# Patient Record
Sex: Female | Born: 1995 | Race: White | Hispanic: No | Marital: Single | Smoking: Never smoker
Health system: Southern US, Community
[De-identification: ages and names within clinical notes are randomized; demographics above are authoritative.]

## PROBLEM LIST (undated history)

## (undated) DIAGNOSIS — C9100 Acute lymphoblastic leukemia not having achieved remission: Secondary | ICD-10-CM

## (undated) HISTORY — DX: Acute lymphoblastic leukemia not having achieved remission: C91.00

## (undated) HISTORY — PX: OTHER SURGICAL HISTORY: SHX169

---

## 2017-01-05 DIAGNOSIS — C9101 Acute lymphoblastic leukemia, in remission: Secondary | ICD-10-CM | POA: Insufficient documentation

## 2017-02-20 ENCOUNTER — Other Ambulatory Visit
Admission: RE | Admit: 2017-02-20 | Discharge: 2017-02-20 | Disposition: A | Discharge status: Home / Self Care | Payer: No Typology Code available for payment source | Source: Ambulatory Visit | Attending: Pediatric Hematology and Oncology | Admitting: Pediatric Hematology and Oncology

## 2017-02-20 DIAGNOSIS — C9101 Acute lymphoblastic leukemia, in remission: Secondary | ICD-10-CM | POA: Diagnosis not present

## 2017-02-20 LAB — CBC WITH DIFFERENTIAL/PLATELET
BASOS PCT: 0 %
Basophils Absolute: 0 10*3/uL (ref 0–0.1)
EOS ABS: 0 10*3/uL (ref 0–0.7)
EOS PCT: 0 %
HCT: 27.9 % — ABNORMAL LOW (ref 35.0–47.0)
HEMOGLOBIN: 9.8 g/dL — AB (ref 12.0–16.0)
Lymphocytes Relative: 42 %
Lymphs Abs: 0.5 10*3/uL — ABNORMAL LOW (ref 1.0–3.6)
MCH: 37.6 pg — ABNORMAL HIGH (ref 26.0–34.0)
MCHC: 35.3 g/dL (ref 32.0–36.0)
MCV: 106.4 fL — ABNORMAL HIGH (ref 80.0–100.0)
Monocytes Absolute: 0.2 10*3/uL (ref 0.2–0.9)
Monocytes Relative: 17 %
NEUTROS PCT: 41 %
Neutro Abs: 0.5 10*3/uL — ABNORMAL LOW (ref 1.4–6.5)
Platelets: 229 10*3/uL (ref 150–440)
RBC: 2.62 MIL/uL — AB (ref 3.80–5.20)
RDW: 17 % — AB (ref 11.5–14.5)
WBC: 1.2 10*3/uL — AB (ref 3.6–11.0)

## 2019-06-20 ENCOUNTER — Ambulatory Visit: Payer: No Typology Code available for payment source | Attending: Internal Medicine

## 2019-06-20 DIAGNOSIS — Z20822 Contact with and (suspected) exposure to covid-19: Secondary | ICD-10-CM

## 2019-06-21 LAB — NOVEL CORONAVIRUS, NAA: SARS-CoV-2, NAA: NOT DETECTED

## 2019-07-18 ENCOUNTER — Ambulatory Visit: Payer: No Typology Code available for payment source | Attending: Internal Medicine

## 2019-07-18 DIAGNOSIS — Z20822 Contact with and (suspected) exposure to covid-19: Secondary | ICD-10-CM

## 2019-07-19 LAB — NOVEL CORONAVIRUS, NAA: SARS-CoV-2, NAA: NOT DETECTED

## 2019-07-20 ENCOUNTER — Other Ambulatory Visit: Payer: No Typology Code available for payment source

## 2020-03-21 ENCOUNTER — Other Ambulatory Visit: Payer: Self-pay | Admitting: Sports Medicine

## 2020-03-21 DIAGNOSIS — M25461 Effusion, right knee: Secondary | ICD-10-CM

## 2020-03-21 DIAGNOSIS — G8929 Other chronic pain: Secondary | ICD-10-CM

## 2020-04-05 ENCOUNTER — Ambulatory Visit
Admission: RE | Admit: 2020-04-05 | Discharge: 2020-04-05 | Disposition: A | Discharge status: Home / Self Care | Payer: 59 | Source: Ambulatory Visit | Attending: Sports Medicine | Admitting: Sports Medicine

## 2020-04-05 ENCOUNTER — Other Ambulatory Visit: Payer: Self-pay

## 2020-04-05 DIAGNOSIS — M25461 Effusion, right knee: Secondary | ICD-10-CM | POA: Diagnosis present

## 2020-04-05 DIAGNOSIS — M25561 Pain in right knee: Secondary | ICD-10-CM | POA: Insufficient documentation

## 2020-04-05 DIAGNOSIS — G8929 Other chronic pain: Secondary | ICD-10-CM | POA: Diagnosis present

## 2020-04-05 IMAGING — MR MR KNEE*R* W/O CM
6 series · 40 of 40 positions shown · non-contrast
Comparison: None.

CLINICAL DATA: History of leukemia with steroid therapy. Lateral
right knee pain, swelling and inflammation for 1 year. No known
injury.

EXAM:
MRI OF THE RIGHT KNEE WITHOUT CONTRAST
TECHNIQUE: Multiplanar, multisequence MR imaging of the knee was performed. No
intravenous contrast was administered.

[Series 15: T2 fat-sat · axial · right · 4.0mm · 0.50mm/px · z∈[-31,+92]mm · 7 of 26 slices shown (1 of 3)]
[im 1/26]
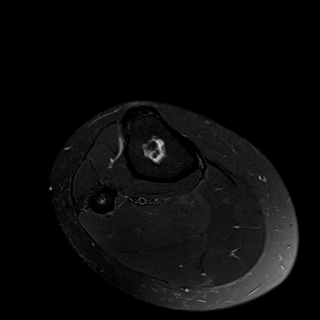
[im 5/26]
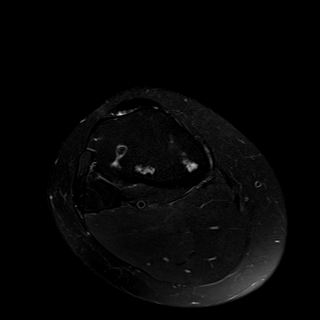
[im 9/26]
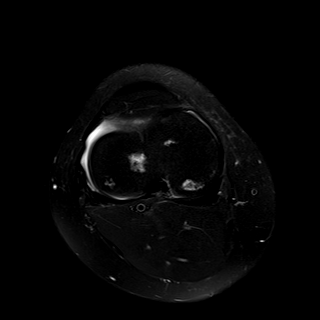
[im 13/26]
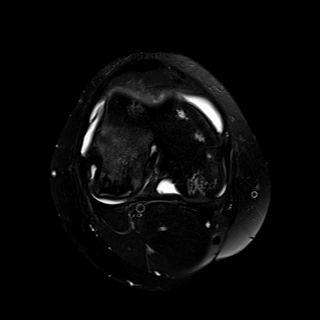
[im 17/26]
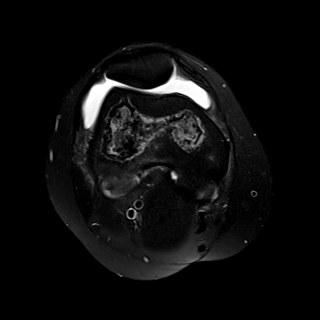
[im 21/26]
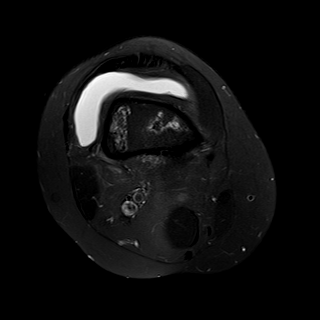
[im 26/26]
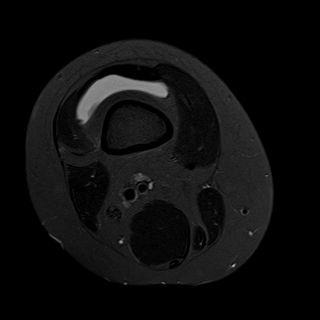

[Series 16: T1 · coronal · right · 4.0mm · 0.39mm/px · 7 of 28 slices shown]
[im 1/28]
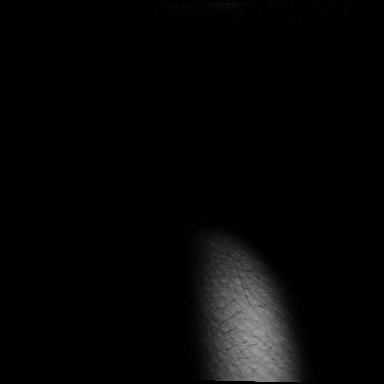
[im 5/28]
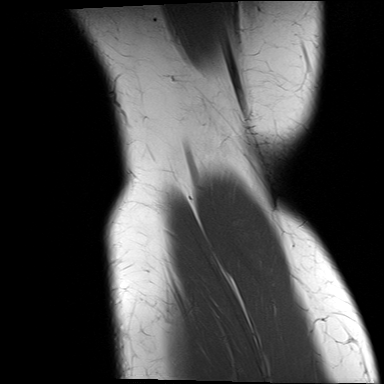
[im 10/28]
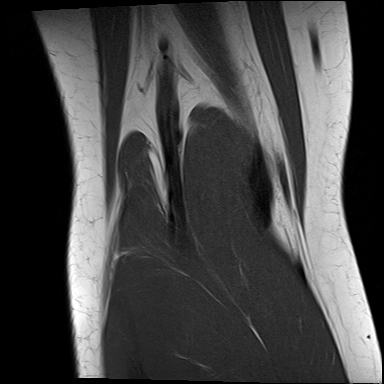
[im 14/28]
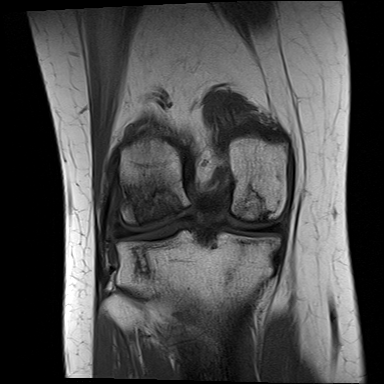
[im 19/28]
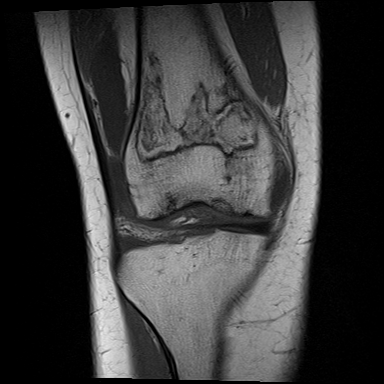
[im 23/28]
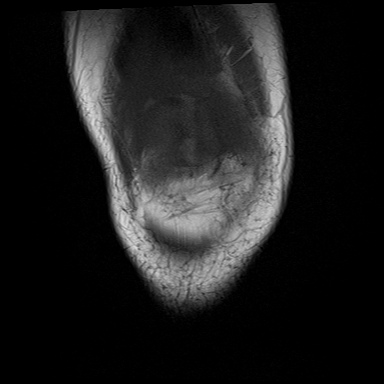
[im 28/28]
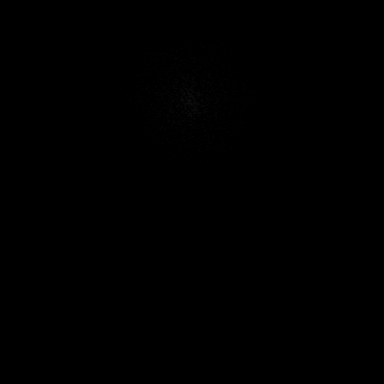

[Series 17: T2 fat-sat · coronal · right · 4.0mm · 0.59mm/px · 6 of 28 slices shown (2 of 3)]
[im 1/28]
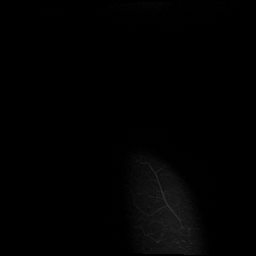
[im 6/28]
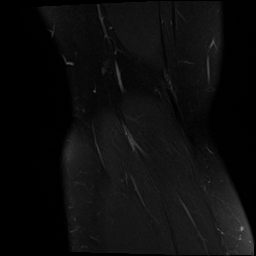
[im 11/28]
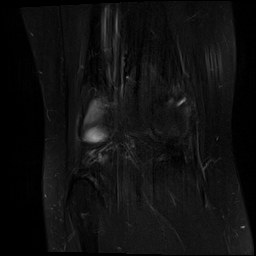
[im 17/28]
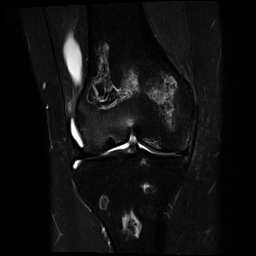
[im 22/28]
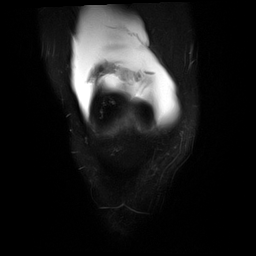
[im 28/28]
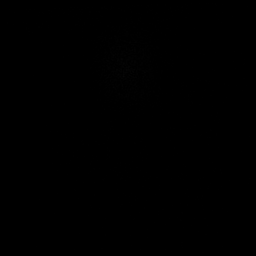

[Series 18: PD fat-sat · coronal · right · 4.0mm · 0.59mm/px · 6 of 28 slices shown (1 of 2)]
[im 1/28]
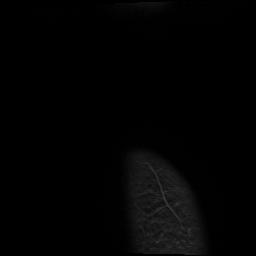
[im 6/28]
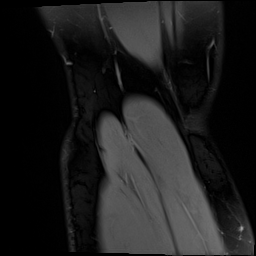
[im 11/28]
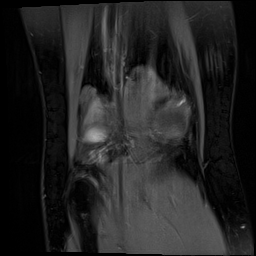
[im 17/28]
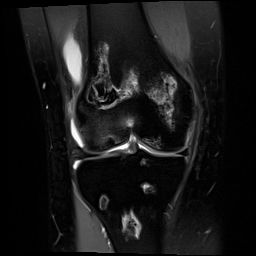
[im 22/28]
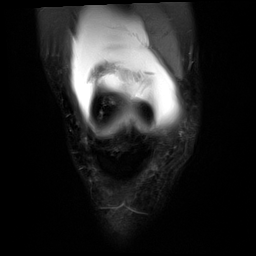
[im 28/28]
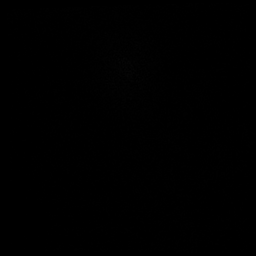

[Series 19: PD fat-sat · sagittal · right · 3.0mm · 0.59mm/px · 7 of 31 slices shown (2 of 2)]
[im 1/31]
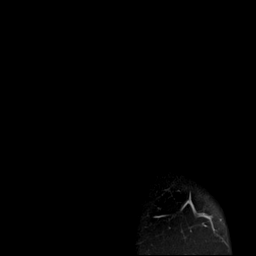
[im 6/31]
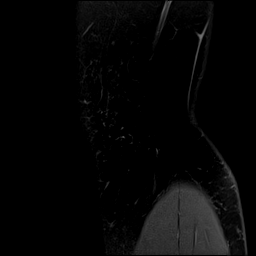
[im 11/31]
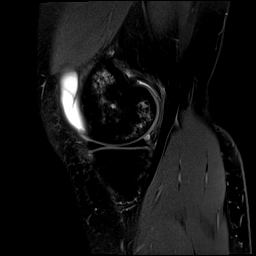
[im 16/31]
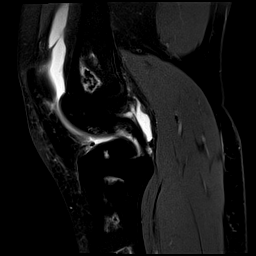
[im 21/31]
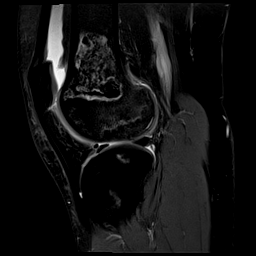
[im 26/31]
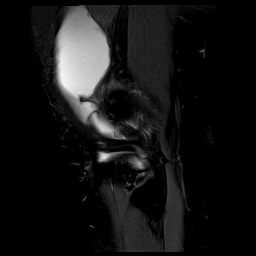
[im 31/31]
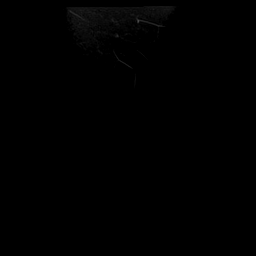

[Series 20: T2 fat-sat · sagittal · right · 3.0mm · 0.59mm/px · 7 of 32 slices shown (3 of 3)]
[im 1/32]
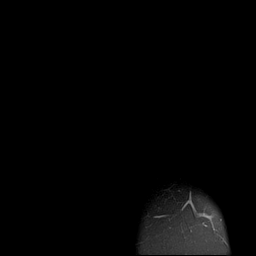
[im 6/32]
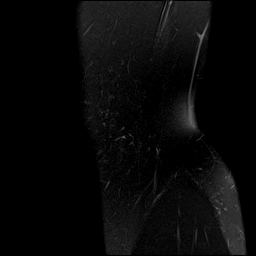
[im 11/32]
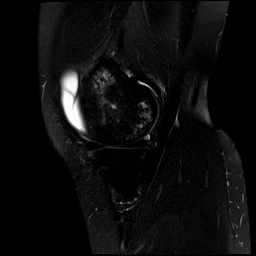
[im 16/32]
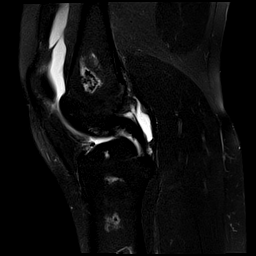
[im 21/32]
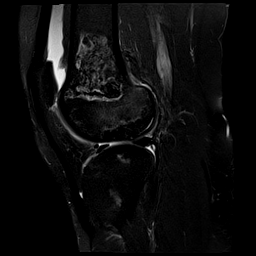
[im 26/32]
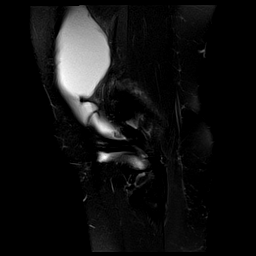
[im 32/32]
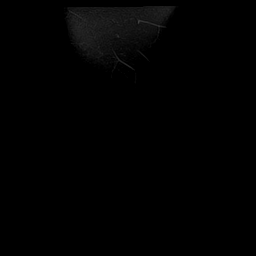

[40 of 40 positions shown; findings below may reference images not displayed]

FINDINGS: MENISCI

Medial meniscus:  Intact.

Lateral meniscus:  Intact.

LIGAMENTS

Cruciates:  Intact.

Collaterals:  Intact.

CARTILAGE

Patellofemoral:  Normal.

Medial:  Normal.

Lateral:  Normal.

Joint:  Moderately large joint effusion.

Popliteal Fossa:  No Baker's cyst.

Extensor Mechanism:  Intact.

Bones: The patient has multiple medullary infarcts in the distal
femur, proximal tibia and fibula. There is flattening of the
subchondral bone plate of the central weight-bearing lateral femoral
condyle measuring 1.6 cm transverse by 1.6 cm AP with underlying
subchondral edema. A small amount of fluid undermines the
subchondral bone plate of the posterior aspect of the lateral
femoral condyle involving an area measuring 1.8 cm transverse by
cm craniocaudal.

Other: None.
IMPRESSION: Multiple medullary infarcts. Flattening of the subchondral bone
plate of the central weight-bearing medial femoral condyle with
underlying marrow edema is compatible with an acute or subacute
insufficiency fracture. As noted above, fluid undermines a small
fragment of subchondral bone along the posterior weight-bearing
lateral femoral condyle worrisome for fragment instability.

Negative for meniscal or ligament tear.

Moderately large joint effusion.

## 2020-07-12 ENCOUNTER — Other Ambulatory Visit: Payer: Self-pay | Admitting: Orthopedic Surgery

## 2020-07-12 DIAGNOSIS — M25551 Pain in right hip: Secondary | ICD-10-CM

## 2020-07-18 ENCOUNTER — Ambulatory Visit
Admission: RE | Admit: 2020-07-18 | Discharge: 2020-07-18 | Disposition: A | Discharge status: Home / Self Care | Payer: 59 | Source: Ambulatory Visit | Attending: Orthopedic Surgery | Admitting: Orthopedic Surgery

## 2020-07-18 DIAGNOSIS — M25551 Pain in right hip: Secondary | ICD-10-CM | POA: Diagnosis present

## 2020-07-18 IMAGING — MR MR HIP*R* W/O CM
5 series · 38 of 40 positions shown · non-contrast
Comparison: None.

CLINICAL DATA: Right hip pain for 1 year. History of steroid
therapy.

EXAM:
MR OF THE RIGHT HIP WITHOUT CONTRAST
TECHNIQUE: Multiplanar, multisequence MR imaging was performed. No intravenous
contrast was administered.

[Series 14: T1 · coronal · right · 4.0mm · 1.19mm/px · 9 of 40 slices shown]
[im 1/40]
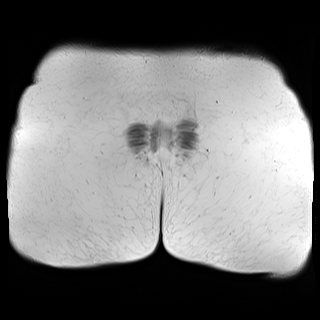
[im 5/40]
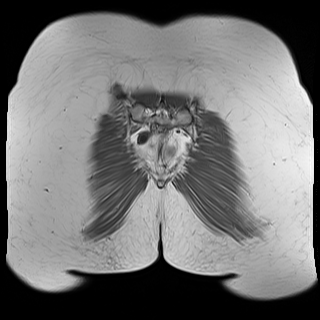
[im 9/40]
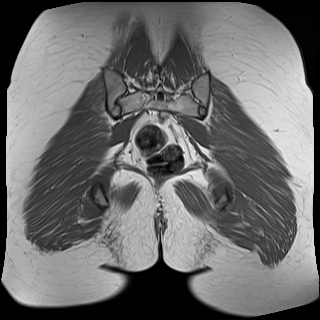
[im 14/40]
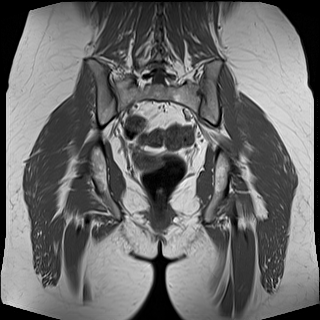
[im 18/40]
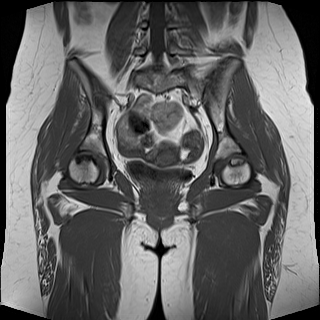
[im 22/40]
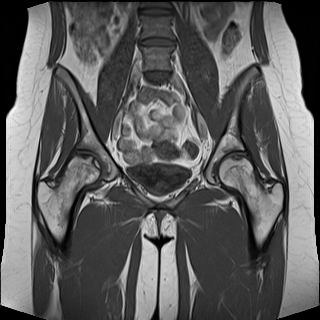
[im 27/40]
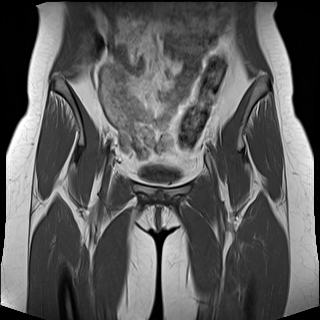
[im 35/40]
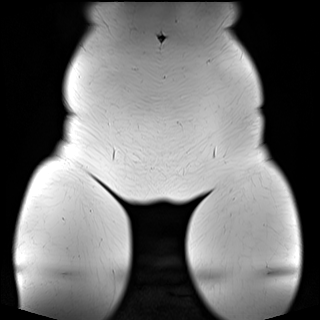
[im 40/40]
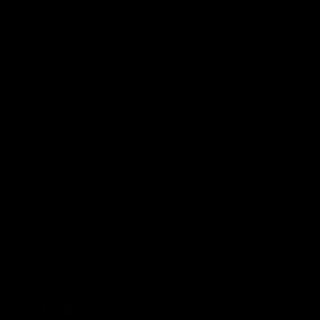

[Series 15: STIR · coronal · right · 4.0mm · 1.19mm/px · 8 of 40 slices shown]
[im 1/40]
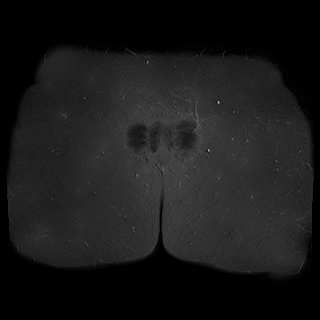
[im 5/40]
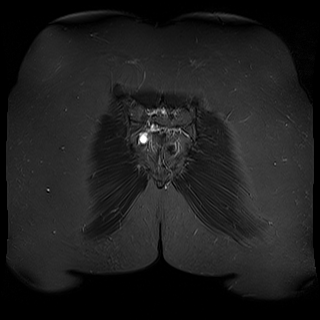
[im 10/40]
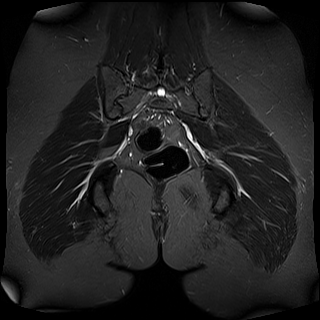
[im 15/40]
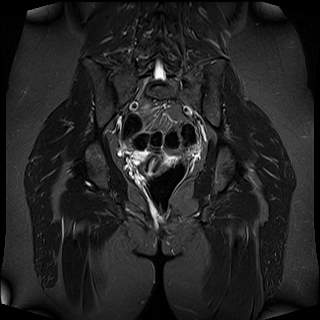
[im 25/40]
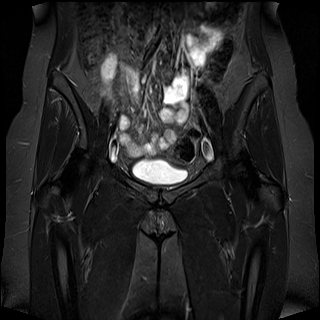
[im 30/40]
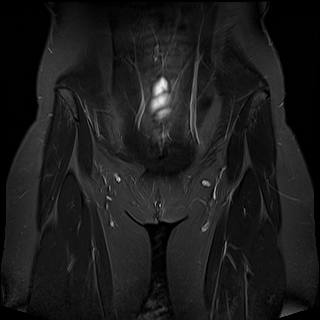
[im 35/40]
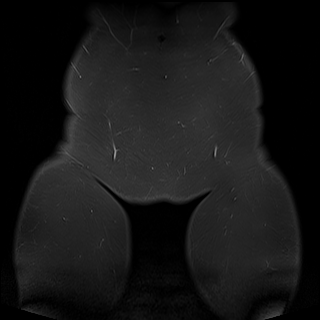
[im 40/40]
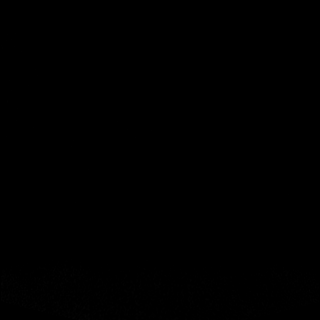

[Series 16: T2 fat-sat · axial · right · 4.0mm · 0.70mm/px · z∈[-147,-2]mm · 7 of 30 slices shown]
[im 1/30]
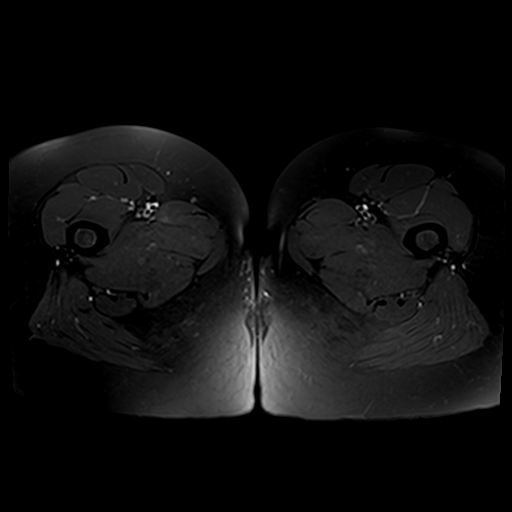
[im 5/30]
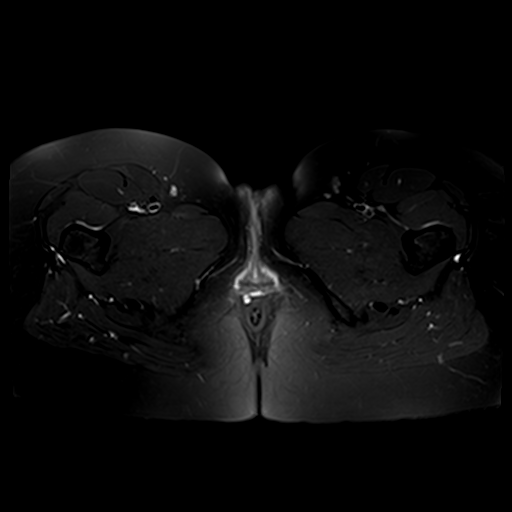
[im 10/30]
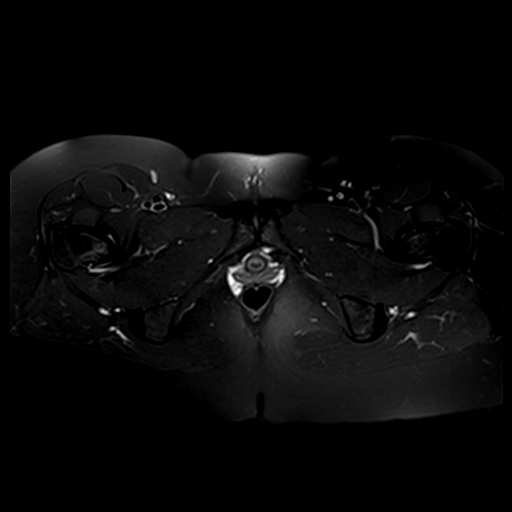
[im 15/30]
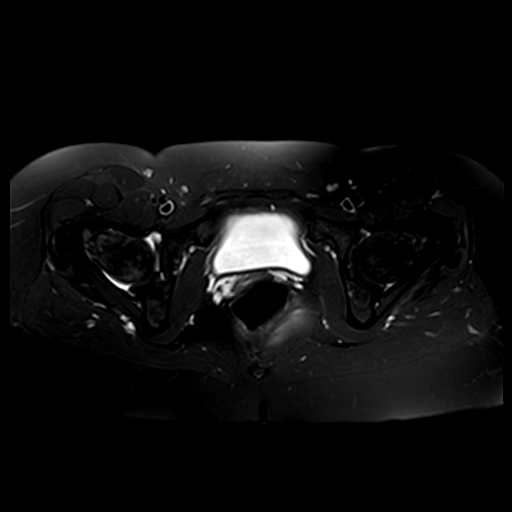
[im 20/30]
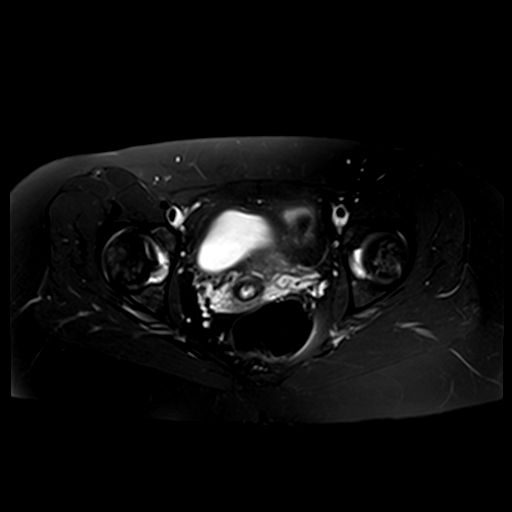
[im 25/30]
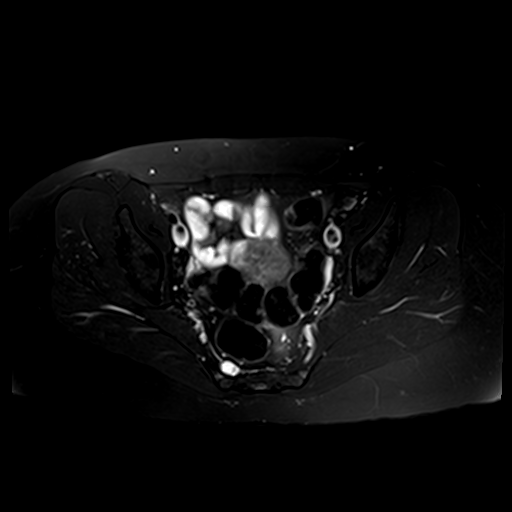
[im 30/30]
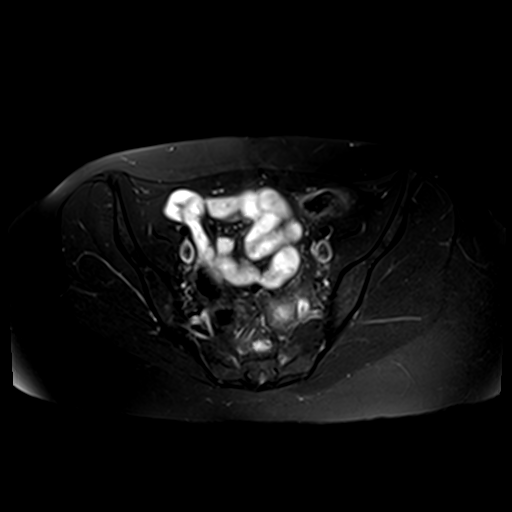

[Series 17: PD fat-sat · sagittal · right · 4.0mm · 0.70mm/px · 7 of 31 slices shown (1 of 2)]
[im 1/31]
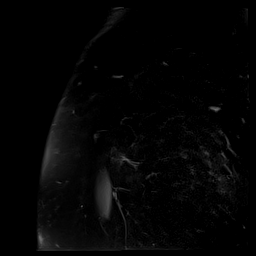
[im 6/31]
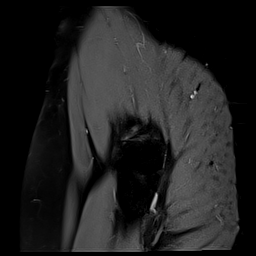
[im 11/31]
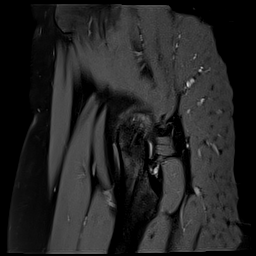
[im 16/31]
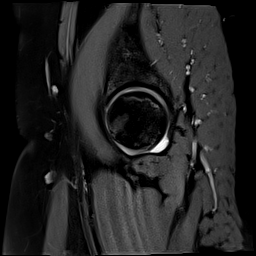
[im 21/31]
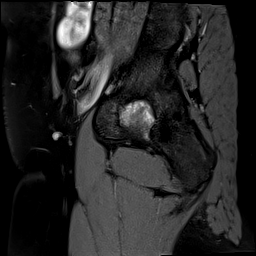
[im 26/31]
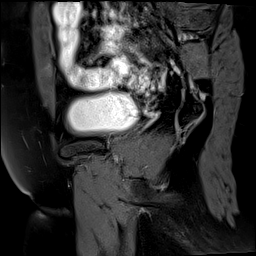
[im 31/31]
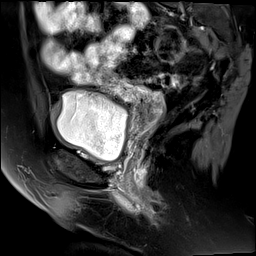

[Series 18: PD fat-sat · coronal · right · 4.0mm · 0.70mm/px · 7 of 29 slices shown (2 of 2)]
[im 1/29]
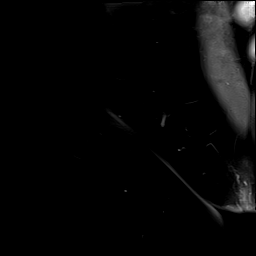
[im 5/29]
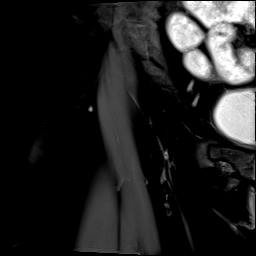
[im 10/29]
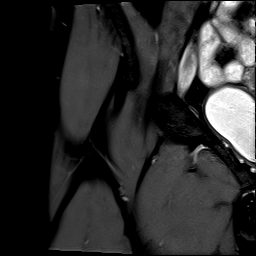
[im 15/29]
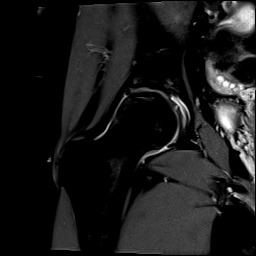
[im 19/29]
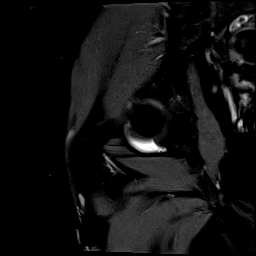
[im 24/29]
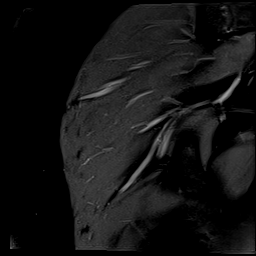
[im 29/29]
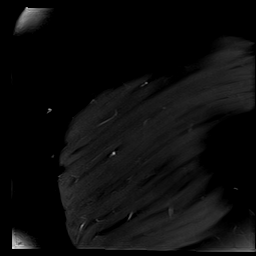

[38 of 40 positions shown; findings below may reference images not displayed]

FINDINGS: Bones: Geographic areas of low T1/T2 signal with peripheral rim of
T2 hyperintensity involving the is superior and superomedial aspects
of the bilateral femoral heads compatible with avascular necrosis
(series 15, images 19-21). Flattening of the right femoral head
contour superiorly compatible with a degree of mild subarticular
collapse. The left femoral head contour is maintained. There is mild
patchy marrow edema throughout the right femoral head and femoral
neck. Remaining osseous structures are within normal limits. No
marrow replacing bone lesion.

Articular cartilage and labrum

Articular cartilage: Moderate cartilage thinning and surface
irregularity of the superior aspect of the right hip joint with mild
subchondral marrow edema within the superior acetabulum.

Labrum:  No discernible labral tear.  No paralabral cyst.

Joint or bursal effusion

Joint effusion:  Small right hip joint effusion.

Bursae: No abnormal bursal fluid collection.

Muscles and tendons

Muscles and tendons: The gluteal, hamstring, iliopsoas, rectus
femoris, and adductor tendons appear intact without tear or
significant tendinosis. Normal muscle bulk and signal intensity
without edema, atrophy, or fatty infiltration.

Other findings

Miscellaneous: No soft tissue edema or fluid collection. No inguinal
lymphadenopathy. Incidental note of 2 well-circumscribed ovoid T2
hyperintense cystic structures within the presacral space measuring
up to 1.6 cm and 1.3 cm (series 16, images 2 and 6). No acute
findings within the pelvis.
IMPRESSION: 1. Avascular necrosis of the bilateral femoral heads with mild
subarticular collapse on the right.
2. Patchy marrow edema within the right femoral head and femoral
neck which is likely reactive.
3. Mild-moderate osteoarthritis of the right hip.
4. Small right hip joint effusion.
5. Incidental note of two well-circumscribed cystic structures
within the presacral space measuring up to 1.6 cm and 1.3 cm, which
may represent benign peritoneal inclusion cysts. Findings are of
doubtful clinical significance.

## 2020-07-23 DIAGNOSIS — E538 Deficiency of other specified B group vitamins: Secondary | ICD-10-CM | POA: Insufficient documentation

## 2020-09-19 ENCOUNTER — Telehealth: Payer: Self-pay | Admitting: Obstetrics and Gynecology

## 2020-09-19 ENCOUNTER — Other Ambulatory Visit: Payer: Self-pay

## 2020-09-19 ENCOUNTER — Encounter: Payer: Self-pay | Admitting: Obstetrics and Gynecology

## 2020-09-19 ENCOUNTER — Ambulatory Visit (INDEPENDENT_AMBULATORY_CARE_PROVIDER_SITE_OTHER): Payer: 59 | Admitting: Obstetrics and Gynecology

## 2020-09-19 ENCOUNTER — Other Ambulatory Visit (HOSPITAL_COMMUNITY)
Admission: RE | Admit: 2020-09-19 | Discharge: 2020-09-19 | Disposition: A | Discharge status: Home / Self Care | Payer: 59 | Source: Ambulatory Visit | Attending: Obstetrics and Gynecology | Admitting: Obstetrics and Gynecology

## 2020-09-19 VITALS — BP 118/72 | Ht 63.0 in | Wt 152.1 lb

## 2020-09-19 DIAGNOSIS — Z124 Encounter for screening for malignant neoplasm of cervix: Secondary | ICD-10-CM | POA: Diagnosis not present

## 2020-09-19 DIAGNOSIS — Z01419 Encounter for gynecological examination (general) (routine) without abnormal findings: Secondary | ICD-10-CM

## 2020-09-19 DIAGNOSIS — Z113 Encounter for screening for infections with a predominantly sexual mode of transmission: Secondary | ICD-10-CM

## 2020-09-19 DIAGNOSIS — Z0000 Encounter for general adult medical examination without abnormal findings: Secondary | ICD-10-CM

## 2020-09-19 NOTE — Telephone Encounter (Signed)
Pt is scheduled for Nexaplanon placement on 4/7 at 1:50 with [facility].

## 2020-09-19 NOTE — Telephone Encounter (Signed)
Noted. Will order to arrive by apt date/time. 

## 2020-09-20 NOTE — Progress Notes (Signed)
Gynecology Annual Exam  PCP: Daryl Eastern, MD  Chief Complaint:  Chief Complaint  Patient presents with  . Gynecologic Exam    Annual - need referral for fertility. RM 5   History of Present Illness: Patient is a 25 y.o., G0, presents for annual exam. The patient has no complaints today. Patient does have questions regarding Nexplanon for contraception. 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  LMP: Patient's last menstrual period was 08/28/2020. Average Interval: regular, 28 days Duration of flow: 4-5 days Heavy Menses: historically heavy flow, currently managed with OCP Clots: no Intermenstrual Bleeding: no Postcoital Bleeding: no Dysmenorrhea: yes  The patient is sexually active. She currently uses OCP (estrogen/progesterone) for contraception. She denies dyspareunia. There is no notable family history of breast or ovarian cancer in her family.  The patient wears seatbelts: yes.  The patient has regular exercise: yes. Patient recently diagnosed with avascular necrosis - planning for hip replacement in May. Previous exercise regimen has been effected by this. However, patient reports still cycling 5 days a week for 45 minutes at a time.  The patient denies current symptoms of depression.  Currently taking lexapro - feels symptoms are well managed.  PQH-9: 5 GAD-7: 6  Review of Systems: Review of Systems  Constitutional: Negative.   HENT: Negative.   Eyes: Negative.   Respiratory: Negative.   Cardiovascular: Negative.   Gastrointestinal: Negative.   Genitourinary: Negative.   Musculoskeletal: Positive for joint pain.  Skin: Negative.   Neurological: Negative.   Endo/Heme/Allergies: Negative.   Psychiatric/Behavioral: Negative.     Past Medical History:  Patient Active Problem List   Diagnosis Date Noted  . Vitamin B12 deficiency 07/23/2020  . 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 No pertinent surgical history.  Gynecologic History:  Patient's last menstrual period was 08/28/2020. Contraception: OCP (estrogen/progesterone) Last Pap: none  Obstetric History: G0.  Family History:  History reviewed. No pertinent family history.  Social History:  Social History   Socioeconomic History  . Marital status: Single    Spouse name: Not on file  . Number of children: Not on file  . Years of education: Not on file  . Highest education level: Not on file  Occupational History  . Not on file  Tobacco Use  . Smoking status: Never Smoker  . Smokeless tobacco: Never Used  Vaping Use  . Vaping Use: Never used  Substance and Sexual Activity  . Alcohol use: Yes    Comment: Casual  . Drug use: Yes    Types: Marijuana    Comment: occasionally  . Sexual activity: Not Currently    Partners: Male    Birth control/protection: Pill  Other Topics Concern  . Not on file  Social History Narrative  . Not on file   Social Determinants of Health   Financial Resource Strain: Not on file  Food Insecurity: Not on file  Transportation Needs: Not on file  Physical Activity: Not on file  Stress: Not on file  Social Connections: Not on file  Intimate Partner Violence: Not on file    Allergies:  Allergies  Allergen Reactions  . Pegaspargase Anaphylaxis and Shortness Of Breath    Medications: Prior to Admission medications   Medication Sig Start Date End Date Taking? Authorizing Provider  escitalopram (LEXAPRO) 10 MG tablet Take 1 tablet by mouth daily. 09/14/20  Yes [provider]  norethindrone-ethinyl estradiol (LOESTRIN FE) 1-20 MG-MCG tablet Take 1 tablet by mouth daily. 02/18/19  Yes [provider]  propranolol (INDERAL) 20 MG tablet TAKE ONE TABLET BY MOUTH TWO TIMES DAILY AS NEEDED FOR ANXIETY 09/11/20  Yes [provider]    Physical Exam Vitals: Blood  pressure 118/72, height 5\' 3"  (1.6 m), weight 152 lb 2 oz (69 kg), last menstrual period 08/28/2020.  General: NAD HEENT: normocephalic, anicteric Thyroid: no enlargement, no palpable nodules Pulmonary: No increased work of breathing, CTAB Cardiovascular: RRR, distal pulses 2+ Abdomen: NABS, soft, non-tender, non-distended.  Umbilicus without lesions.  No hepatomegaly, splenomegaly or masses palpable. No evidence of hernia  Genitourinary:  External: Normal external female genitalia.  Normal urethral meatus, normal Bartholin's and Skene's glands.    Vagina: Normal vaginal mucosa, no evidence of prolapse.    Cervix: Grossly normal in appearance, no bleeding  Deferred bimanual exam  Lymphatic: no evidence of inguinal lymphadenopathy Extremities: no edema, erythema, or tenderness Neurologic: Grossly intact Psychiatric: mood appropriate, affect full  Assessment: 25 y.o., G0, for routine annual exam  Plan: Problem List Items Addressed This Visit   None   Visit Diagnoses    Screening for cervical cancer    -  Primary   Relevant Orders   Cytology - PAP   Screen for sexually transmitted diseases       Relevant Orders   Cervicovaginal ancillary only   Routine health maintenance       Encounter for gynecological examination without abnormal finding          1) 4) Gardasil Series discussed and if applicable offered to patient - Patient  has previously completed 3 shot series   2) STI screening  wasoffered and accepted - patient declined blood work testing for STIs  3)  ASCCP guidelines and rational discussed.  Patient opts for every 3 years screening interval - pap collected  4) Contraception - the patient is currently using  OCP (estrogen/progesterone).  She is interested in changing to Nexplanon We discussed safe sex practices to reduce her furture risk of STI's. Patient encouraged to schedule nexplanon placement at her convenience. Information provided in handout.  5) Patient with  questions regarding her hx of ALL and chemo treatment and wondering about impact on future fertility. Made recommendation to follow-up with fertility specialist. Patient is relocating to [place], [place] in the summer and plans to seek further evaluation then.  6) Return for Nexplanon placement .  Orlie Pollen, CNM, MSN [facility], [facility] 09/20/2020, 11:34 AM

## 2020-09-21 LAB — CERVICOVAGINAL ANCILLARY ONLY
Chlamydia: NEGATIVE
Comment: NEGATIVE
Comment: NEGATIVE
Comment: NORMAL
Neisseria Gonorrhea: NEGATIVE
Trichomonas: NEGATIVE

## 2020-09-24 LAB — CYTOLOGY - PAP: Adequacy: ABNORMAL

## 2020-09-28 NOTE — Progress Notes (Signed)
Finding reviewed with patient via message. Recommended repeat pap collection.

## 2020-10-01 NOTE — Telephone Encounter (Signed)
Patient cancelled apt.

## 2020-10-04 ENCOUNTER — Ambulatory Visit: Payer: 59 | Admitting: Obstetrics and Gynecology

## 2020-10-08 ENCOUNTER — Other Ambulatory Visit: Payer: Self-pay

## 2020-10-08 ENCOUNTER — Other Ambulatory Visit (HOSPITAL_COMMUNITY)
Admission: RE | Admit: 2020-10-08 | Discharge: 2020-10-08 | Disposition: A | Discharge status: Home / Self Care | Payer: 59 | Source: Ambulatory Visit | Attending: Obstetrics and Gynecology | Admitting: Obstetrics and Gynecology

## 2020-10-08 ENCOUNTER — Encounter: Payer: Self-pay | Admitting: Obstetrics and Gynecology

## 2020-10-08 ENCOUNTER — Ambulatory Visit (INDEPENDENT_AMBULATORY_CARE_PROVIDER_SITE_OTHER): Payer: 59 | Admitting: Obstetrics and Gynecology

## 2020-10-08 VITALS — BP 100/60 | Ht 63.0 in | Wt 153.0 lb

## 2020-10-08 DIAGNOSIS — Z124 Encounter for screening for malignant neoplasm of cervix: Secondary | ICD-10-CM | POA: Diagnosis not present

## 2020-10-08 NOTE — Progress Notes (Signed)
   Patient ID: Ashley Mcfarland, female   DOB: 08/12/1995, 25 y.o.   MRN: [id number]  Reason for Consult: Repeat Pap Smear   Referred by Daryl Eastern, *  Subjective:     HPI:  Ashley Mcfarland is a 25 y.o. female who presents for a repeat pap smear. She reports no additional concerns today.  Past Medical History:  Diagnosis Date  . Acute lymphoblastic leukemia (ALL) ([place])    History reviewed. No pertinent family history. Past Surgical History:  Procedure Laterality Date  . OTHER SURGICAL HISTORY     Port implant and removal    Short Social History:  Social History   Tobacco Use  . Smoking status: Never Smoker  . Smokeless tobacco: Never Used  Substance Use Topics  . Alcohol use: Yes    Comment: Casual    Allergies  Allergen Reactions  . Pegaspargase Anaphylaxis and Shortness Of Breath    Current Outpatient Medications  Medication Sig Dispense Refill  . escitalopram (LEXAPRO) 10 MG tablet Take 1 tablet by mouth daily.    . norethindrone-ethinyl estradiol (LOESTRIN FE) 1-20 MG-MCG tablet Take 1 tablet by mouth daily.    . propranolol (INDERAL) 20 MG tablet TAKE ONE TABLET BY MOUTH TWO TIMES DAILY AS NEEDED FOR ANXIETY     No current facility-administered medications for this visit.    Review of Systems  Constitutional:  Constitutional negative. HENT: HENT negative.  Eyes: Eyes negative.  Respiratory: Respiratory negative.  Cardiovascular: Cardiovascular negative.  GI: Gastrointestinal negative.  GU: Genitourinary negative. Musculoskeletal: Positive for joint pain.  Skin: Skin negative.  Neurological: Neurological negative. Hematologic: Hematologic/lymphatic negative.  Psychiatric: Psychiatric negative.        Objective:  Objective   Vitals:   10/08/20 0805  BP: 100/60  Weight: 153 lb (69.4 kg)  Height: 5\' 3"  (1.6 m)   Body mass index is 27.1 kg/m.  Physical Exam Constitutional:      Appearance: Normal appearance.  Pulmonary:      Effort: Pulmonary effort is normal.  Abdominal:     General: Abdomen is flat.  Genitourinary:    General: Normal vulva.     Vagina: No vaginal discharge.     Comments: External: Normal appearing vulva. No lesions noted.  Speculum examination: Normal appearing cervix. No blood in the vaginal vault. Scant white discharge.  Bimanual exam deferred Musculoskeletal:        General: Normal range of motion.  Skin:    General: Skin is warm and dry.  Neurological:     General: No focal deficit present.     Mental Status: She is alert and oriented to person, place, and time.  Psychiatric:        Mood and Affect: Mood normal.        Behavior: Behavior normal.     Assessment/Plan:    25 yo, G0, presents for repeat pap smear.   Problem List Items Addressed This Visit   None   Visit Diagnoses    Screening for cervical cancer    -  Primary   Relevant Orders   Cytology - PAP        Orlie Pollen, [facility] 10/08/2020 8:20 AM

## 2020-10-11 LAB — CYTOLOGY - PAP: Diagnosis: NEGATIVE

## 2021-02-18 ENCOUNTER — Telehealth (INDEPENDENT_AMBULATORY_CARE_PROVIDER_SITE_OTHER): Admitting: Family

## 2021-02-18 NOTE — Telephone Encounter (Signed)
 Noted.

## 2021-02-18 NOTE — Telephone Encounter (Signed)
 Patient called to be set up as a new patient of: NP Candiss Norse    New Patient appointment scheduled with:   Date and time of appointment:  03/12/21 at 130PM  New patient appointment sub-type (physical, sick visit (provide symptoms), consult, etc.):   Patient's last PCP (name): Dr Carren Rang  Location of last PCP (phone number & address): [address] [place], [place] [address] phone [phone]  Date of Last Annual Physical: 09/2020  Date of Last Medicare Wellness (if age 51+): n/a  Is there anyone you would authorize to verbally speak for you (make appts, get lab results, etc)? Yes. Father - Fatisha Rabalais [phone]  (If yes, list person(s) & notify patient signed release needed)   Pharmacy name & city (also add to Medication Management section):    [facility] [address] [place]  Instructed Patient: Office no longer accepts Cash as a form of payment. Forms of Payments accepted are Check, Debit or Credit.  Please bring a Photo ID, Insurance Card, and Current medication bottles.   Please arrive 15-20 minutes prior to appointment due to paperwork needing to be filled out.  Mask is required in the office at all times.    For Office: Runner, broadcasting/film/video authorization (does not need signature from patient) to the above former PCP.    Verified insurance in [place].    Call back number: [phone]

## 2021-03-11 ENCOUNTER — Encounter

## 2021-03-11 ENCOUNTER — Telehealth (INDEPENDENT_AMBULATORY_CARE_PROVIDER_SITE_OTHER): Admitting: Family

## 2021-03-11 ENCOUNTER — Other Ambulatory Visit

## 2021-03-11 NOTE — Telephone Encounter (Signed)
 Noted.

## 2021-03-11 NOTE — Telephone Encounter (Signed)
 We are asking you to attest for your own safety and the safety of our staff.    Fever No  SOB No  Headaches No  New loss of smell/taste No  Fatigue No  New cough (not related to chronic condition) No  New nasal congestion or runny nose (not related to seasonal allergies) No  Muscle aches No  Aches and Pains No  Sore throat No  Diarrhea No  Have you been tested for COVID? No  If so, Where (facility)? N/A  Date? N/A  COVID Result:    Positive / Negative N/A  Has anyone you've been in contact with or in your household tested positive for covid? If YES, When? No    Call back number: [phone]

## 2021-03-12 ENCOUNTER — Ambulatory Visit: Admit: 2021-03-12 | Discharge: 2021-03-15 | Payer: PRIVATE HEALTH INSURANCE | Attending: Family | Primary: Family

## 2021-03-12 ENCOUNTER — Encounter (INDEPENDENT_AMBULATORY_CARE_PROVIDER_SITE_OTHER)

## 2021-03-12 ENCOUNTER — Encounter (INDEPENDENT_AMBULATORY_CARE_PROVIDER_SITE_OTHER): Admitting: Family

## 2021-03-12 VITALS — BP 118/68 | HR 134 | Temp 97.4°F | Ht 63.0 in | Wt 153.0 lb

## 2021-03-12 DIAGNOSIS — C9101 Acute lymphoblastic leukemia, in remission: Secondary | ICD-10-CM

## 2021-03-12 NOTE — Assessment & Plan Note (Signed)
 Had recent surgery   Handicap form competed and given to pt

## 2021-03-12 NOTE — Progress Notes (Signed)
flu

## 2021-03-12 NOTE — Progress Notes (Signed)
 [facility]  [facility]  [address]  [place] [address]  Dept: [phone]  Dept Fax: [phone]     Patient ID: Meghan Wyatt is a 25 y.o. female who presents for new patient visit .    Subjective   25 year old female for new pt visit today She moved here recently from [place]  She has pmh of ALL diagnosed at age 6  She had lots of joint pains and problems in past year and found to have AVN right hip and knee - she has had THR and had knee surgery just recently in [place]   She is wearing brace and on crutches  She requests a [employer] placard from [place]  She has one for state of [place] but now moved here - says needs for 9-12 mos and then a reassessment  She had phys and pap back in May 2022 and says labs are all utd  She does need a new pediatric hematology oncologist for ALL follow up annually  She is also on lexapro - was on for a while due to anxiety and issues surrounding illness joint pains etc - she is feeling ready to come off and wants to know how to do so  Also she would like a flu vaccine today       Patient Active Problem List   Diagnosis   . Knee osteonecrosis (CMS/HCC)   . 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 Primary osteoarthritis of both hips   . Vitamin B12 deficiency     Current Outpatient Medications   Medication Instructions   . escitalopram (LEXAPRO) 10 mg, oral, Daily RT   . norethindrone-e.estradioL-iron (Junel FE 1/20) 1 mg-20 mcg (21)/75 mg (7) tablet 1 tablet, oral, Daily RT     Allergies   Allergen Reactions   . Asparaginase - Peg, E. Coli Shortness of breath   . Pegaspargase Anaphylaxis, Angioedema and Shortness of breath       Objective   Visit Vitals  BP 118/68 (BP Location: Right arm, Patient Position: Sitting, BP Cuff Size: Adult)   Pulse 134   Temp 36.3 C (97.4 F) (Temporal)   Ht 1.6 m   Wt 69.4 kg   SpO2 98%   BMI 27.10 kg/m   BSA 1.76 m        Physical Exam  Constitutional:       Appearance: Normal appearance.   HENT:      Head: Normocephalic and atraumatic.      Right Ear: Tympanic membrane and external ear normal.      Left Ear: Tympanic membrane and external ear normal.   Eyes:      Extraocular Movements: Extraocular movements intact.      Pupils: Pupils are equal, round, and reactive to light.   Neck:      Vascular: No carotid bruit.   Cardiovascular:      Rate and Rhythm: Normal rate and regular rhythm.      Pulses: Normal pulses.      Heart sounds: Normal heart sounds.   Musculoskeletal:      Cervical back: Normal range of motion and neck supple. No tenderness.      Comments: Brace right knee and crutches s/p surgery right knee for OA and hx of avn    Skin:     General: Skin is warm and dry.   Neurological:      General:  No focal deficit present.      Mental Status: She is alert.   Psychiatric:         Mood and Affect: Mood normal.         Behavior: Behavior normal.         Assessment/Plan   [name] was seen today for new patient visit .  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 f/u  Preventative health care  Comments:  flu vaccine today

## 2021-03-12 NOTE — Assessment & Plan Note (Signed)
 HAd THR right hip   S/p AVN due to steroids/hx of ALL

## 2021-03-15 ENCOUNTER — Other Ambulatory Visit

## 2021-05-22 ENCOUNTER — Encounter (INDEPENDENT_AMBULATORY_CARE_PROVIDER_SITE_OTHER)

## 2021-07-10 ENCOUNTER — Encounter: Payer: PRIVATE HEALTH INSURANCE | Attending: Family | Primary: Family

## 2021-09-30 ENCOUNTER — Ambulatory Visit (HOSPITAL_BASED_OUTPATIENT_CLINIC_OR_DEPARTMENT_OTHER)

## 2021-09-30 ENCOUNTER — Ambulatory Visit (HOSPITAL_BASED_OUTPATIENT_CLINIC_OR_DEPARTMENT_OTHER): Admitting: Student in an Organized Health Care Education/Training Program

## 2021-10-14 ENCOUNTER — Encounter

## 2021-10-14 ENCOUNTER — Ambulatory Visit: Admitting: Student in an Organized Health Care Education/Training Program

## 2021-10-14 ENCOUNTER — Ambulatory Visit (HOSPITAL_BASED_OUTPATIENT_CLINIC_OR_DEPARTMENT_OTHER)

## 2021-10-14 DIAGNOSIS — C9101 Acute lymphoblastic leukemia, in remission: Secondary | ICD-10-CM

## 2021-10-14 LAB — CBC WITH DIFFERENTIAL
Basophils %: 0.5 %
Basophils Absolute: 0.04 10*3/uL (ref 0.00–0.22)
Eosinophils %: 0.8 %
Eosinophils Absolute: 0.07 10*3/uL (ref 0.00–0.50)
Hematocrit: 40.1 % (ref 32.0–47.0)
Hemoglobin: 13.8 g/dL (ref 11.0–16.0)
Immature Granulocytes %: 0.2 %
Immature Granulocytes Absolute: 0.02 10*3/uL (ref 0.00–0.10)
Lymphocyte %: 20.6 %
Lymphocytes Absolute: 1.78 10*3/uL (ref 0.70–4.00)
MCH: 30.6 pg (ref 26.0–34.0)
MCHC: 34.4 g/dL (ref 31.0–37.0)
MCV: 88.9 fL (ref 80.0–100.0)
MPV: 9.6 fL (ref 9.1–12.4)
Monocytes %: 3.6 %
Monocytes Absolute: 0.31 10*3/uL — ABNORMAL LOW (ref 0.36–0.77)
NRBC %: 0 % (ref 0.0–0.0)
NRBC Absolute: 0 10*3/uL (ref 0.00–2.00)
Neutrophil %: 74.3 %
Neutrophils Absolute: 6.4 10*3/uL (ref 1.50–7.95)
Platelets: 293 10*3/uL (ref 150–400)
RBC: 4.51 M/uL (ref 3.70–5.20)
RDW-CV: 11.9 % (ref 11.5–14.5)
RDW-SD: 38.5 fL (ref 35.0–51.0)
WBC: 8.6 10*3/uL (ref 4.0–11.0)

## 2021-10-14 LAB — URINALYSIS
Bilirubin, Ur: NEGATIVE
Blood, Ur: NEGATIVE
Glucose, Ur: NEGATIVE
Ketones, Ur: NEGATIVE
Leukocyte Esterase, Ur: NEGATIVE
Nitrite, Ur: NEGATIVE
Protein, Ur: NEGATIVE
Specific Gravity, Ur: 1.009 (ref 1.001–1.035)
Urobilinogen, Ur: 0.2 EU
pH, Ur: 5.5

## 2021-10-14 LAB — COMPREHENSIVE METABOLIC PANEL
ALT: 11 U/L (ref 0–55)
AST: 13 U/L (ref 6–42)
Albumin: 4.8 g/dL (ref 3.2–5.0)
Alkaline phosphatase: 69 U/L (ref 30–130)
Anion Gap: 11 mmol/L (ref 3–14)
BUN: 10 mg/dL (ref 6–24)
Bilirubin, total: <0.2 mg/dL — ABNORMAL LOW (ref 0.2–1.2)
CO2 (Bicarbonate): 23 mmol/L (ref 20–32)
Calcium: 10 mg/dL (ref 8.5–10.5)
Chloride: 103 mmol/L (ref 98–110)
Creatinine: 0.57 mg/dL (ref 0.55–1.30)
Glucose: 94 mg/dL (ref 70–139)
Potassium: 4.4 mmol/L (ref 3.6–5.2)
Protein, total: 7.9 g/dL (ref 6.0–8.4)
Sodium: 137 mmol/L (ref 135–146)
eGFRcr: 130 mL/min/{1.73_m2} (ref >=60)

## 2021-10-14 LAB — LIPID PANEL
Cholesterol/HDL Ratio: 2.5
Cholesterol: 182 mg/dL (ref <=200)
HDL cholesterol: 73 mg/dL (ref >=40)
LDL cholesterol, calculated: 84 mg/dL (ref 0–130)
Non-HDL Calculation: 109 mg/dL (ref <160)
Triglycerides: 125 mg/dL (ref <=150)

## 2021-10-14 LAB — LDH: LDH: 132 U/L (ref 110–250)

## 2021-10-14 NOTE — Progress Notes (Signed)
Clinic Visit Note  MRN: [id number] Patient Name: Meghan Wyatt     DOB:  1995/11/28 Gender: female    AGE:   26 y.o. 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 female w/ PMH of B-ALL (diagnosed in 07/2015, treated off trial per [id number] at [facility], completed in 10/2017) and right hip AVN who presents to establish care.  Most of history obtained is taken from records at [facility] with corroboration from Ms. Meghan Wyatt.  She states she originally presented to [facility] in 2017 when during a skiing trip she noted petechiae in her ankles. She went to the ED where she was found to have elevated WBC of 16.9.  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 Per the patient she was started on treatment off trial per [id number] protocol, stratified to the [facility] arm at the end of induction due to age greater than 13 years.  Her D8 PB MRD < 0.01% and end of induction BM < 0.01%.  Her provider was NP Jacquelyn R. Rencher.  She developed allergic reaction to pegaspargase, resolved with switching to asparaginase Erwinia chrysanthemi.  Treatment course also complicated by MSSA bacteremia in 08/2015 requiring prolonged IV antibiotics, and febrile neutropenia in 06/2016 during C1 of maintenance.  Course further complicated by significant GERD symptoms when on prolonged steroids, joint pain when acutely stopped steroids, and some facial flushing when on steroids. She did not have these issues with the 5-day maintenance dosing of steroids.  She completed therapy on 11/11/17 and has been in remission.  She had received most of her treatment at [facility] as she was attending college at [employer].  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  She is seeing Dr. Lynne Logan and underwent a right hip bone graft last year.  Unfortunately the bone graft did not succeed and she is discussing hip replacement in the near future.  She moved to [place] in 02/2021 for a new job at [employer].    Ms. Meghan Wyatt presents to establish care.  She states that she has persistent pain in her hips and knees.  She alternates taking ibuprofen and Tylenol throughout the day.  She denies any fevers, night sweats, chills, nausea, vomiting, or diarrhea.  Her energy and appetite are excellent.  She used to be physically active but exercising has been more limited due to her avascular necrosis.    Past Medical History:   Diagnosis Date   . ALL (acute lymphoblastic leukemia of infant) (CMS/HCC)    . Avascular necrosis (CMS/HCC)        Past Surgical History:   Procedure Laterality Date   . BONE GRAFT Right     right hip   . KNEE ARTHROSCOPY W/ OATS PROCEDURE Left        Family History   Problem Relation Name Age of Onset   . Gestational diabetes Mother     . Diabetes type I Father     . No Known Problems Sister     . Diabetes type I Brother     . Diabetes type II Maternal Grandmother     . Congenital heart disease Paternal Grandfather  Social History     Tobacco Use   . Smoking status: Never   . Smokeless tobacco: Never   Vaping Use   . Vaping status: Not on file   Substance Use Topics   . Alcohol use: Yes     Comment: Socially   . Drug use: Yes     Types: Marijuana       Allergies   Allergen Reactions   . Asparaginase - Peg, E. Coli Shortness of breath   . Pegaspargase Anaphylaxis, Angioedema and Shortness of breath       Current Outpatient Medications   Medication Instructions   . norethindrone-e.estradioL-iron (Junel FE 1/20) 1 mg-20 mcg (21)/75 mg (7) tablet 1 tablet, oral, Daily RT       Physical Exam:    Vitals:    10/14/21 0953   BP: 105/69   BP Location: Right arm   Patient Position: Sitting   BP Cuff Size: Adult   Pulse: 78   Resp: 20    Temp: 36.3 C (97.3 F)   TempSrc: Temporal   SpO2: 100%   Weight: 67.3 kg   Height: 1.623 m   PainSc:   3   PainLoc: Jaw       Performance Status: 1 - Symptomatic but completely ambulatory  Constitutional: Well-developed, well-nourished in no acute distress.  HEENT: No oral lesions.  Lymphatic: No cervical, supraclavicular, axillary or inguinal adenopathy.  Cardiac: Regular rate and rhythm.  No murmurs, rubs or gallops.  Respiratory: Clear to auscultation.  No wheeze, rales, or rhonchi.  Abdomen: BS normal.  Soft and non-tender. No hepatosplenomegaly. No masses, guarding or rebound.   Extremities: No lower extremity edema. Calves are soft and non-tender.  Neurologic: No focal deficits.   Skin: No petechiae or ecchymoses. No rashes or lesions.     Labs:      Recent Results (from the past 48 hour(s))   Comprehensive metabolic panel    Collection Time: 10/14/21 10:44 AM   Result Value Ref Range    Sodium 137 135 - 146 mmol/L    Potassium 4.4 3.6 - 5.2 mmol/L    Chloride 103 98 - 110 mmol/L    CO2 (Bicarbonate) 23 20 - 32 mmol/L    Anion Gap 11 3 - 14 mmol/L    BUN 10 6 - 24 mg/dL    Creatinine 2.95 6.21 - 1.30 mg/dL    eGFRcr 308 >=65 HQ/ION/6.29B*2    Glucose 94 70 - 139 mg/dL    Fasting? Unknown     Calcium 10.0 8.5 - 10.5 mg/dL    AST 13 6 - 42 U/L    ALT 11 0 - 55 U/L    Alkaline phosphatase 69 30 - 130 U/L    Protein, total 7.9 6.0 - 8.4 g/dL    Albumin 4.8 3.2 - 5.0 g/dL    Bilirubin, total <8.4 (L) 0.2 - 1.2 mg/dL   Lactate dehydrogenase    Collection Time: 10/14/21 10:44 AM   Result Value Ref Range    LDH 132 110 - 250 U/L   Lipid panel    Collection Time: 10/14/21 10:44 AM   Result Value Ref Range    Triglycerides 125 <=150 mg/dL    Cholesterol 132 <=440 mg/dL    HDL cholesterol 73 >=10 mg/dL    LDL cholesterol, calculated 84 0 - 130 mg/dL    Cholesterol/HDL Ratio 2.5     Non-HDL Calculation 109 <160 mg/dL   Urinalysis    Collection Time:  10/14/21 10:44 AM   Result Value Ref Range    Color, Ur Yellow Light  Yellow, Yellow    Clarity, Ur Clear Clear    Specific Gravity, Ur 1.009 1.001 - 1.035    pH, Ur 5.5 5.0, 5.5, 6.0, 6.5, 7.0, 7.5, 8.0    Protein, Ur Negative Negative    Glucose, Ur Negative Negative    Ketones, Ur Negative Negative    Blood, Ur Negative Negative    Bilirubin, Ur Negative Negative    Urobilinogen, Ur 0.2 0.2 , 1.0 EU    Nitrite, Ur Negative Negative    Leukocyte Esterase, Ur Negative Negative   CBC w/ Differential    Collection Time: 10/14/21 10:44 AM   Result Value Ref Range    WBC 8.6 4.0 - 11.0 K/uL    RBC 4.51 3.70 - 5.20 M/uL    Hemoglobin 13.8 11.0 - 16.0 g/dL    Hematocrit 16.1 09.6 - 47.0 %    MCV 88.9 80.0 - 100.0 fL    MCH 30.6 26.0 - 34.0 pg    MCHC 34.4 31.0 - 37.0 g/dL    RDW-CV 04.5 40.9 - 81.1 %    RDW-SD 38.5 35.0 - 51.0 fL    Platelets 293 150 - 400 K/uL    MPV 9.6 9.1 - 12.4 fL    Neutrophil % 74.3 %    Lymphocyte % 20.6 %    Monocytes % 3.6 %    Eosinophils % 0.8 %    Basophils % 0.5 %    Immature Granulocytes % 0.2 %    NRBC % 0.0 0.0 - 0.0 %    Neutrophils Absolute 6.40 1.50 - 7.95 K/uL    Lymphocytes Absolute 1.78 0.70 - 4.00 K/uL    Monocytes Absolute 0.31 (L) 0.36 - 0.77 K/uL    Eosinophils Absolute 0.07 0.00 - 0.50 K/uL    Basophils Absolute 0.04 0.00 - 0.22 K/uL    Immature Granulocytes Absolute 0.02 0.00 - 0.10 K/uL    NRBC Absolute 0.00 0.00 - 2.00 K/uL       Imaging:  TTE (06/05/20): [facility]):  Normal left ventricular function  Normal LA pressure with normal diastolic function  Normal right ventricular systolic function  Valvular regurgitation: trivial MR, trivial PR, trivial TR  No valvular stenosis  No prior studies for comparison    Pathology:  No pathology to review    Assessment & Plan:  Ms. Bussie is a 26 y.o. female w/ PMH of B-ALL (diagnosed in 07/2015, treated off trial per [id number] at [facility], completed in 10/2017) and right hip AVN who presents to establish care.    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  Her D8 PB MRD < 0.01% and end of induction BM < 0.01%.  She completed therapy on 11/11/17 and has been in remission. Unfortunately, her treatment course was complicated by avascular necrosis for which she follows with Dr. Lynne Logan.  Otherwise, she clinically feels well.  Lab work today without evidence of recurrence.  She will follow-up with [place] in 3 months and can likely space out after then to every 6 months until she reaches the 5-year mark off treatment when she can transition to survivorship clinic. We discussed the symptoms which should prompt her to call us for sooner  evaluation.  - Continue to monitor  - Obtain records from [facility] on treatment course  - Obtain original pathology slides from [facility]  - TTE every 5 years (last 05/2020 at [facility])  - Lipids yearly (last today 09/2021)  - Complete physical exam yearly (last today 09/2021)  - CBC at each visit (normal today)  - Annual UA (last today 09/2021)  - Yearly eye exam (last exam unknown): referral placed today to ophthalmology  - Follow up PCP for rest of health maintenance  - Inquire into whether there is an orthopedic surgeon who specializes in avascular necrosis locally    RTC in 6 months    Patient seen and discussed with Dr. Herma Mering, MD  Fellow, Hematology-Oncology  [facility]

## 2021-10-29 LAB — ANATOMIC PATHOLOGY CONSULT: Clinical Information: 24

## 2021-11-11 ENCOUNTER — Encounter

## 2021-11-11 ENCOUNTER — Encounter (INDEPENDENT_AMBULATORY_CARE_PROVIDER_SITE_OTHER): Admitting: Family

## 2021-11-19 ENCOUNTER — Ambulatory Visit: Payer: BLUE CROSS/BLUE SHIELD | Attending: Ophthalmology | Primary: Family

## 2021-11-19 LAB — ANATOMIC PATHOLOGY CONSULT: Clinical Information: 24

## 2021-11-19 NOTE — Progress Notes (Deleted)
Pre-charting for 11/19/21  New patient referred for comprehensive eye exam  Testing: none  ~~~~~~~~~~~~~~~~~~~~~

## 2021-11-22 ENCOUNTER — Encounter: Payer: BLUE CROSS/BLUE SHIELD | Attending: Family | Primary: Family

## 2021-11-27 ENCOUNTER — Ambulatory Visit: Admit: 2021-11-27 | Discharge: 2021-11-27 | Payer: BLUE CROSS/BLUE SHIELD | Attending: Family | Primary: Family

## 2021-11-27 DIAGNOSIS — Z0000 Encounter for general adult medical examination without abnormal findings: Secondary | ICD-10-CM

## 2021-11-27 NOTE — Assessment & Plan Note (Signed)
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

## 2021-11-27 NOTE — Progress Notes (Signed)
[facility]  [facility]  [address]  [place] [address]  Dept: [phone]  Dept Fax: [phone]     Patient ID: Meghan Wyatt is a 26 y.o. female who presents for Annual Exam (And preop).    Subjective   26 year old female for physical exam and preop today She is known to office Her meds and problems are reviewed today She is doing well overall Hx of B-ALL diagnosed in 2017 Treated and doing well however has avascular necrosis r/t steroids  Has tried other options but such as bone graft and also decompression but did not help Scheduled for bilateral THR in [place] in June on 14th 2023  She will recover with her family back in [place] will then return to [place] for her work at [employer]  She recently had visit with her hemeonc in [place] at [facility] She was cleared and doing well  She has NO cp sob or dysp No abd pain or n/vd/c/ Nobrb or melena No urinary sx       Patient Active Problem List   Diagnosis   . Knee osteonecrosis (CMS/HCC)   . 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 Primary osteoarthritis of both hips   . Vitamin B12 deficiency     Current Outpatient Medications   Medication Instructions   . norethindrone-e.estradioL-iron (Junel FE 1/20) 1 mg-20 mcg (21)/75 mg (7) tablet 1 tablet, oral, Daily RT   . propranolol (INDERAL) 20 mg, oral, 2 times daily     Allergies   Allergen Reactions   . Asparaginase - Peg, E. Coli Shortness of breath   . Pegaspargase Anaphylaxis, Angioedema and Shortness of breath       Objective   Visit Vitals  BP 90/64 (BP Location: Right arm, Patient Position: Sitting, BP Cuff Size: Adult)   Pulse 85   Temp 36.5 C (97.7 F) (Temporal)   Ht 1.626 m   Wt 65 kg   LMP 11/21/2021 (Exact Date)   SpO2 99%   BMI 24.61 kg/m   OB Status Having periods   BSA 1.71 m       Physical Exam  Constitutional:       Appearance: Normal appearance.   HENT:      Head:  Normocephalic and atraumatic.      Right Ear: Tympanic membrane, ear canal and external ear normal.      Left Ear: Tympanic membrane, ear canal and external ear normal.   Eyes:      Extraocular Movements: Extraocular movements intact.      Pupils: Pupils are equal, round, and reactive to light.   Neck:      Vascular: No carotid bruit.   Cardiovascular:      Rate and Rhythm: Normal rate and regular rhythm.      Pulses: Normal pulses.      Heart sounds: Normal heart sounds. No murmur heard.  Pulmonary:      Effort: Pulmonary effort is normal.      Breath sounds: Normal breath sounds.   Abdominal:      Palpations: Abdomen is soft.      Tenderness: There is no abdominal tenderness.   Musculoskeletal:      Cervical back: Normal range of motion and neck supple.      Comments: Scar lateral right hip  Pain with rotation in groin bilat    Skin:  General: Skin is warm and dry.   Neurological:      General: No focal deficit present.      Mental Status: She is alert.   Psychiatric:         Mood and Affect: Mood normal.         Thought Content: Thought content normal.         Judgment: Judgment normal.         Assessment/Plan   Meghan Wyatt was seen today for annual exam.  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

## 2021-11-27 NOTE — Assessment & Plan Note (Signed)
Had visit with heme onc in april 2023 [place]  In remission and doing well

## 2021-11-28 LAB — COMPREHENSIVE METABOLIC PANEL
ALT: 26 U/L (ref 0–55)
AST: 14 U/L (ref 6–42)
Albumin: 4.4 g/dL (ref 3.2–5.0)
Alkaline phosphatase: 64 U/L (ref 30–130)
Anion Gap: 8 mmol/L (ref 3–14)
BUN: 10 mg/dL (ref 6–24)
Bilirubin, total: 0.4 mg/dL (ref 0.2–1.2)
CO2 (Bicarbonate): 27 mmol/L (ref 20–32)
Calcium: 10.1 mg/dL (ref 8.5–10.5)
Chloride: 104 mmol/L (ref 98–110)
Creatinine: 0.59 mg/dL (ref 0.55–1.30)
Glucose: 78 mg/dL (ref 70–139)
Potassium: 4.1 mmol/L (ref 3.6–5.2)
Protein, total: 8.2 g/dL (ref 6.0–8.4)
Sodium: 139 mmol/L (ref 135–146)
eGFRcr: 129 mL/min/{1.73_m2} (ref >=60)

## 2021-11-28 LAB — CBC
Hematocrit: 47.3 % — ABNORMAL HIGH (ref 32.0–47.0)
Hemoglobin: 15.6 g/dL (ref 11.0–16.0)
MCH: 30.4 pg (ref 26.0–34.0)
MCHC: 33 g/dL (ref 31.0–37.0)
MCV: 92.2 fL (ref 80.0–100.0)
MPV: 9.7 fL (ref 9.1–12.4)
NRBC %: 0 % (ref 0.0–0.0)
NRBC Absolute: 0 10*3/uL (ref 0.00–2.00)
Platelets: 285 10*3/uL (ref 150–400)
RBC: 5.13 M/uL (ref 3.70–5.20)
RDW-CV: 12.8 % (ref 11.5–14.5)
RDW-SD: 43.2 fL (ref 35.0–51.0)
WBC: 7.3 10*3/uL (ref 4.0–11.0)

## 2021-11-28 LAB — PROTIME-INR
INR: 0.91
Protime: 9.8 s (ref 9.4–11.4)

## 2021-11-28 LAB — TSH WITH REFLEX: TSH: 0.667 u[IU]/mL (ref 0.358–3.740)

## 2022-03-17 ENCOUNTER — Encounter

## 2022-03-17 ENCOUNTER — Encounter: Payer: PRIVATE HEALTH INSURANCE | Attending: Family | Primary: Family

## 2022-04-14 ENCOUNTER — Encounter: Payer: BLUE CROSS/BLUE SHIELD | Primary: Family

## 2022-04-14 ENCOUNTER — Encounter
Payer: BLUE CROSS/BLUE SHIELD | Attending: Student in an Organized Health Care Education/Training Program | Primary: Family

## 2022-12-02 ENCOUNTER — Encounter: Payer: BLUE CROSS/BLUE SHIELD | Attending: Family | Primary: Family
